# Patient Record
Sex: Male | Born: 2000 | Race: White | Hispanic: No | Marital: Single | Smoking: Never smoker
Health system: Southern US, Community
[De-identification: ages and names within clinical notes are randomized; demographics above are authoritative.]

---
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
## 2022-02-23 ENCOUNTER — Other Ambulatory Visit (INDEPENDENT_AMBULATORY_CARE_PROVIDER_SITE_OTHER): Payer: Self-pay | Admitting: Pediatric Gastroenterology

## 2022-03-20 ENCOUNTER — Encounter (INDEPENDENT_AMBULATORY_CARE_PROVIDER_SITE_OTHER): Payer: Self-pay | Admitting: Pediatric Gastroenterology

## 2022-03-20 ENCOUNTER — Ambulatory Visit (INDEPENDENT_AMBULATORY_CARE_PROVIDER_SITE_OTHER): Payer: 59 | Admitting: Pediatric Gastroenterology

## 2022-03-20 VITALS — BP 116/74 | HR 88 | Ht 68.23 in | Wt 151.4 lb

## 2022-03-20 DIAGNOSIS — R1013 Epigastric pain: Secondary | ICD-10-CM

## 2022-03-20 MED ORDER — MIRTAZAPINE 15 MG PO TABS: ORAL_TABLET | ORAL | 1 refills | Status: DC

## 2022-03-20 NOTE — Progress Notes (Signed)
Pediatric Gastroenterology Follow Up Visit ? ? ?REFERRING PROVIDER:  Berline Lopes, MD ?[address] ?[address] ?[place],  [place] [address] ? ? ASSESSMENT:     ?I had the pleasure of seeing Justin Kline, 21 y.o. male (DOB: Dec 25, 2000) who I saw in follow up today for evaluation of nausea and occasional vomiting. 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  My impression is based on his history, normal physical examination and normal laboratory evaluation that I reviewed and was done in your office.  Specifically, he had a normal comprehensive metabolic panel, normal lipase, and negative screening for celiac disease.  His urinalysis was normal.  His blood work was done on February 02, 2021.  He also had a normal CBC, normal sedimentation rate. In June 2022 he had a normal abdominal ultrasound. ? ?Buspirone failed him but he is doing well on mirtazapine. We changed to mirtazapine after his last visit due to ongoing nausea on buspirone. He is able to eat without nausea. We are pleased with his response to mirtazapine. ?  ?  ? ?PLAN:       ?Mirtazapine 15 mg QHS - refilled ?Thank you for allowing [place] to participate in the care of your patient ?  ? ?  ? ?HISTORY OF PRESENT ILLNESS: Justin Kline is a 21 y.o. male (DOB: [birth year]) who is seen in follow up for evaluation of nausea and occasional vomiting. History was obtained from the patient and his mother.  He is doing much better on mirtazapine, with increased appetite and he is enjoying his food. From time to time he has some discomfort, but it is milder. He has not vomited since starting mirtazapine. ? ?Initial history ?[name] began having nausea about 3 to 4 months ago, without a known trigger.  He feels early satiety.  His nausea is quite bothersome and affects his daily routines.  He vomits occasionally.  He does not have dysphagia.  He does not have abdominal pain.  He passes  stool once daily.  He thinks that he has lost about 2 pounds since the beginning of the illness.  When he vomits, the emesis contains food content.  He tried omeprazole 20 mg for 6 weeks without benefit.  When he plays basketball, he feels better.  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 ? ?He has a history of migraines for several years, which preceded the onset of nausea.  His mother has a history of migraines as well.  He lives with his parents and 2 sisters, 89 years of age and 32 years old.  He also has a 58 year old brother who is in college. ? ?PAST MEDICAL HISTORY: ?No past medical history on file. ? ?There is no immunization history on file for this patient. ? ?PAST SURGICAL HISTORY: ?No past surgical history on file. ? ?SOCIAL HISTORY: ?Social History  ? ?Socioeconomic History  ? Marital status: Single  ?  Spouse name: Not on file  ? Number of children: Not on file  ? Years of education: Not on file  ? Highest education level: Not on file  ?Occupational History  ? Not on file  ?Tobacco Use  ? Smoking status: Never  ? Smokeless tobacco: Never  ?Substance and Sexual Activity  ? Alcohol use: Not on file  ? Drug use: Not on file  ? Sexual activity: Not on file  ?Other Topics Concern  ? Not on file  ?  Social History Narrative  ? 11th grade at [employer]. Lives with mom, dad, and siblings.  ? ?Social Determinants of Health  ? ?Financial Resource Strain: Not on file  ?Food Insecurity: Not on file  ?Transportation Needs: Not on file  ?Physical Activity: Not on file  ?Stress: Not on file  ?Social Connections: Not on file  ? ? ?FAMILY HISTORY: ?family history includes Irritable bowel syndrome in his mother; Migraines in his mother. ?  ? ?REVIEW OF SYSTEMS:  ?The balance of 12 systems reviewed is negative except as noted in the HPI.  ? ?MEDICATIONS: ?Current Outpatient Medications  ?Medication Sig Dispense Refill  ? busPIRone (BUSPAR) 10 MG tablet Take 1 tablet (10 mg total) by mouth 3 (three) times  daily. 270 tablet 1  ? influenza vac split quadrivalent PF (FLUARIX QUADRIVALENT) 0.5 ML injection Inject into the muscle. 0.5 mL 0  ? mirtazapine (REMERON) 15 MG tablet TAKE 1 TABLET BY MOUTH EVERYDAY AT BEDTIME 90 tablet 0  ? ?No current facility-administered medications for this visit.  ? ? ?ALLERGIES: ?Patient has no known allergies. ? VITAL SIGNS: ?There were no vitals taken for this visit. ? ?PHYSICAL EXAM: ?Constitutional: Alert, no acute distress, well nourished, and well hydrated.  ?Mental Status: Pleasantly interactive, not anxious appearing. ?HEENT: PERRL, conjunctiva clear, anicteric, oropharynx clear, neck supple, no LAD. ?Respiratory: Clear to auscultation, unlabored breathing. ?Cardiac: Euvolemic, regular rate and rhythm, normal S1 and S2, no murmur. ?Abdomen: Soft, normal bowel sounds, non-distended, non-tender, no organomegaly or masses. ?Perianal/Rectal Exam: Not examined ?Extremities: No edema, well perfused. ?Musculoskeletal: No joint swelling or tenderness noted, no deformities. ?Skin: No rashes, jaundice or skin lesions noted. ?Neuro: No focal deficits.  No nystagmus on lateral gaze.  No facial asymmetry.  No ataxia.  Muscle mass and muscle power are both intact.  Gait is normal. ? ?DIAGNOSTIC STUDIES:  I have reviewed all pertinent diagnostic studies, including: ?No results found for this or any previous visit (from the past 2160 hour(s)).  ? ? ?Jernard Reiber A. Jacqlyn Krauss, MD ?Chief, Division of Pediatric Gastroenterology ?Professor of Pediatrics ?

## 2022-03-20 NOTE — Patient Instructions (Signed)

## 2022-05-24 ENCOUNTER — Encounter (HOSPITAL_BASED_OUTPATIENT_CLINIC_OR_DEPARTMENT_OTHER): Payer: Self-pay

## 2022-05-31 ENCOUNTER — Ambulatory Visit (HOSPITAL_BASED_OUTPATIENT_CLINIC_OR_DEPARTMENT_OTHER): Payer: 59 | Attending: Sports Medicine | Admitting: Physical Therapy

## 2022-05-31 ENCOUNTER — Encounter (HOSPITAL_BASED_OUTPATIENT_CLINIC_OR_DEPARTMENT_OTHER): Payer: Self-pay | Admitting: Physical Therapy

## 2022-05-31 DIAGNOSIS — M5416 Radiculopathy, lumbar region: Secondary | ICD-10-CM | POA: Diagnosis present

## 2022-05-31 DIAGNOSIS — M5459 Other low back pain: Secondary | ICD-10-CM | POA: Insufficient documentation

## 2022-05-31 NOTE — Therapy (Signed)
OUTPATIENT PHYSICAL THERAPY THORACOLUMBAR EVALUATION   Patient Name: Justin Kline MRN: [id number] DOB:2001/02/04, 21 y.o., male Today's Date: 06/01/2022   PT End of Session - 05/31/22 1354     Visit Number 1    Number of Visits 6    Date for PT Re-Evaluation 07/12/22    Authorization Type [employer]    PT Start Time 0845    PT Stop Time 0928    PT Time Calculation (min) 43 min    Activity Tolerance Patient tolerated treatment well    Behavior During Therapy [facility] for tasks assessed/performed             History reviewed. No pertinent past medical history. History reviewed. No pertinent surgical history. There are no problems to display for this patient.   PCP: Sydell Axon, MD  REFERRING PROVIDER:Dr Vickki Hearing REFERRING DIAG: M54.5 - Low back pain, unspecified  Rationale for Evaluation and Treatment Rehabilitation  THERAPY DIAG:  Other low back pain  Radiculopathy, lumbar region  ONSET DATE: Nov 2022  SUBJECTIVE:                                                                                                                                                                                           SUBJECTIVE STATEMENT:  Pt reports insidious onset of L LBP that radiates down into LE; started in November 2022 with no MOI. Pt was fairly active with football, basketball, and gym routine at the time but feels he may have hurt himself lifting weights; he has had a decrease in activity since. He had previous PT in the winter that helped improve his pain but it has gotten worse since his discharge. Prior therapy focused mostly on lumbar extension and lumbar/hip extensor strength; pt feels they helped. His pain is worst with prolonged standing or sitting and occasionally radiates down posterior L LE with some tingling in his foot. He also feels as though he walks with a mild limp. He wants to decrease pain, improve gait mechanics, and get back to the gym and sports.   PERTINENT  HISTORY:  Nausea, prior PT for issue  PAIN:  Are you having pain? Yes: NPRS scale: 5/10 Pain location: L lumbar, radiates down posterior chain into foot Pain description: Ache in back, sharp/nerve like pain into leg Aggravating factors: Prolonged standing or sitting Relieving factors: Heat, Advil, stretching   PRECAUTIONS: None  WEIGHT BEARING RESTRICTIONS No  FALLS:  Has patient fallen in last 6 months? No  LIVING ENVIRONMENT: Lives with: lives with their family Lives in: House/apartment  OCCUPATION: Lifeguard  PLOF: Independent  PATIENT GOALS Decrease pain, improve walking  OBJECTIVE:   DIAGNOSTIC FINDINGS:  Hip x-ray: Negative  PATIENT SURVEYS:  FOTO 60    Expected - 70 at 11 visits  SCREENING FOR RED FLAGS: Bowel or bladder incontinence: No Spinal tumors: No Cauda equina syndrome: No Compression fracture: No Abdominal aneurysm: No  COGNITION:  Overall cognitive status: Within functional limits for tasks assessed     SENSATION: WFL  POSTURE: No Significant postural limitations  PALPATION: Pt is mildly tender to palpation of L lumbar paraspinals at L4/L5 level, L glute med, QL  LUMBAR ROM:   Active  A/PROM  eval  Flexion 50%, limited by pain  Extension WNL  Right lateral flexion   Left lateral flexion   Right rotation WNL  Left rotation WNL   (Blank rows = not tested)  Flexion at painful range reproduces concordant pain in LE.  LOWER EXTREMITY ROM:     Active  Right eval Left eval  Hip flexion WNL WNL  Hip extension    Hip abduction    Hip adduction    Hip internal rotation    Hip external rotation    Knee flexion    Knee extension WNL WNL  Ankle dorsiflexion WNL WNL  Ankle plantarflexion    Ankle inversion    Ankle eversion     (Blank rows = not tested)  Pt lacks 15 degrees of knee extension in seated due to concordant leg pain.   LOWER EXTREMITY MMT:    MMT Right eval Left eval  Hip flexion 51.3 38  Hip extension     Hip abduction 52 49  Hip adduction    Hip internal rotation    Hip external rotation    Knee flexion    Knee extension 55.6 31.4  Ankle dorsiflexion    Ankle plantarflexion    Ankle inversion    Ankle eversion     (Blank rows = not tested)  LUMBAR SPECIAL TESTS:  Straight leg raise test: Positive at 30 degrees, symptoms worsened with ankle DF; relieved with PF.   GAIT:  Pt has mild R thoracic shift during ambulation.   TODAY'S TREATMENT  Eval  Prone push-up Bridging - single leg march Supine knee-to-chest stretch Supine piriformis stretch Nerve gliding - cues for gentle combination motions of ankle DF/PF, cervical flex/ext, and knee flex/ext to allow for nerve glides without pain   PATIENT EDUCATION:  Education details: HEP; anatomy of condition,  Person educated: Patient Education method: Explanation Education comprehension: verbalized understanding   HOME EXERCISE PROGRAM: Access Code: [email or web address] URL: [email or web address] Date: 05/31/2022 Prepared by: Lorayne Bender  Exercises - Prone Push Ups on Forearms  - 1 x daily - 7 x weekly - 3 sets - 10 reps - Single Leg Bridge  - 1 x daily - 7 x weekly - 3 sets - 10 reps - Supine Single Knee to Chest Stretch  - 1 x daily - 7 x weekly - 3 sets - 10 reps - Supine Piriformis Stretch with Foot on Ground  - 1 x daily - 7 x weekly - 3 sets - 10 reps - Seated Slump Nerve Glide  - 1 x daily - 7 x weekly - 3 sets - 10 reps  ASSESSMENT:  CLINICAL IMPRESSION: Patient is a 21 y.o. M who was seen today for physical therapy evaluation and treatment for left sided low back pain. Pt displays symptoms consistent with radiculopathy - concordant L LE pain and neuro sx with lumbar flexion or SLR, tenderness of lower lumbar and glute musculature, and  mild-mod weakness of L LE. Patient is highly motivated to reduce his pain and return to his prior activities and hobbies. He will benefit from skilled therapy to address ROM,  strength, muscular spasms, and return to PLOF.   OBJECTIVE IMPAIRMENTS decreased ROM, decreased strength, increased muscle spasms, and impaired flexibility.   ACTIVITY LIMITATIONS lifting, bending, standing, and locomotion level  PARTICIPATION LIMITATIONS: shopping, community activity, and going to the gym.   PERSONAL FACTORS Age, Fitness, and Past/current experiences are also affecting patient's functional outcome.   REHAB POTENTIAL: Excellent  CLINICAL DECISION MAKING: Stable/uncomplicated  EVALUATION COMPLEXITY: Low   GOALS: Goals reviewed with patient? Yes  SHORT TERM GOALS: Target date: 06/22/2022  Patient will be able to fully extend L knee in seated position to indicate improved nerve mobility. Baseline: Goal status: INITIAL  2.  Pt will be improve L LE strength to within 10% of the contralateral side. Baseline:  Goal status: INITIAL  3.  Pt will improve lumbar flexion ROM to full and painless. Baseline:  Goal status: INITIAL   LONG TERM GOALS: Target date: 07/13/2022  Pt will be able to walk unlimited distances with normalized gait mechanics and no pain. Baseline:  Goal status: INITIAL  2.  Patient will be able to return to gym routine and sports with no difficulty or pain. Baseline:  Goal status: INITIAL   PLAN: PT FREQUENCY: 1x/week  PT DURATION: 6 weeks  PLANNED INTERVENTIONS: Therapeutic exercises, Therapeutic activity, Neuromuscular re-education, Balance training, Gait training, Patient/Family education, Joint mobilization, Dry Needling, Spinal mobilization, Cryotherapy, Moist heat, and Manual therapy.  PLAN FOR NEXT SESSION: Lumbar STM/TrP DN, L LE strengthening, gentle nerve glides if tolerable. Advance to higher level core and gym program as tolerated.    Dessie Coma, PT 06/01/2022, 9:42 AM  Andree Moro SPT   During this treatment session, the therapist was present, participating in and directing the treatment.

## 2022-06-01 ENCOUNTER — Encounter (HOSPITAL_BASED_OUTPATIENT_CLINIC_OR_DEPARTMENT_OTHER): Payer: Self-pay | Admitting: Physical Therapy

## 2022-06-07 ENCOUNTER — Encounter (HOSPITAL_BASED_OUTPATIENT_CLINIC_OR_DEPARTMENT_OTHER): Payer: Self-pay | Admitting: Physical Therapy

## 2022-06-07 ENCOUNTER — Ambulatory Visit (HOSPITAL_BASED_OUTPATIENT_CLINIC_OR_DEPARTMENT_OTHER): Payer: 59 | Admitting: Physical Therapy

## 2022-06-07 DIAGNOSIS — M5416 Radiculopathy, lumbar region: Secondary | ICD-10-CM

## 2022-06-07 DIAGNOSIS — M5459 Other low back pain: Secondary | ICD-10-CM

## 2022-06-07 NOTE — Therapy (Signed)
OUTPATIENT PHYSICAL THERAPY THORACOLUMBAR EVALUATION   Patient Name: Justin Kline MRN: [id number] DOB:March 25, 2001, 21 y.o., male Today's Date: 06/07/2022   PT End of Session - 06/07/22 0850     Visit Number 2    Number of Visits 6    Date for PT Re-Evaluation 07/12/22    Authorization Type [employer]    PT Start Time 0752    PT Stop Time 0834    PT Time Calculation (min) 42 min    Activity Tolerance Patient tolerated treatment well    Behavior During Therapy [facility] for tasks assessed/performed              History reviewed. No pertinent past medical history. History reviewed. No pertinent surgical history. There are no problems to display for this patient.   PCP: Berline Lopes, MD  REFERRING PROVIDER:Dr Rodolph Bong REFERRING DIAG: M54.5 - Low back pain, unspecified  Rationale for Evaluation and Treatment Rehabilitation  THERAPY DIAG:  Other low back pain  Radiculopathy, lumbar region  ONSET DATE: Nov 2022  SUBJECTIVE:                                                                                                                                                                                           SUBJECTIVE STATEMENT:  Tight through hip and lower back today.    PERTINENT HISTORY:  Nausea, prior PT for issue  PAIN:  Are you having pain? Yes: NPRS scale: 0/10 Pain location: L lumbar, radiates down posterior chain into foot Pain description: Ache in back, sharp/nerve like pain into leg Aggravating factors: Prolonged standing or sitting Relieving factors: Heat, Advil, stretching   PRECAUTIONS: None  WEIGHT BEARING RESTRICTIONS No  FALLS:  Has patient fallen in last 6 months? No  LIVING ENVIRONMENT: Lives with: lives with their family Lives in: House/apartment  OCCUPATION: Lifeguard  PLOF: Independent  PATIENT GOALS Decrease pain, improve walking   OBJECTIVE:   DIAGNOSTIC FINDINGS:  Hip x-ray: Negative  PATIENT SURVEYS:  FOTO  60    Expected - 70 at 11 visits  SCREENING FOR RED FLAGS: Bowel or bladder incontinence: No Spinal tumors: No Cauda equina syndrome: No Compression fracture: No Abdominal aneurysm: No  COGNITION:  Overall cognitive status: Within functional limits for tasks assessed     SENSATION: WFL  POSTURE: No Significant postural limitations  PALPATION: Pt is mildly tender to palpation of L lumbar paraspinals at L4/L5 level, L glute med, QL  LUMBAR ROM:   Active  A/PROM  eval  Flexion 50%, limited by pain  Extension WNL  Right lateral flexion   Left lateral flexion   Right rotation WNL  Left rotation WNL   (Blank rows = not tested)  Flexion at painful range reproduces concordant pain in LE.  LOWER EXTREMITY ROM:     Active  Right eval Left eval  Hip flexion WNL WNL  Hip extension    Hip abduction    Hip adduction    Hip internal rotation    Hip external rotation    Knee flexion    Knee extension WNL WNL  Ankle dorsiflexion WNL WNL  Ankle plantarflexion    Ankle inversion    Ankle eversion     (Blank rows = not tested)  Pt lacks 15 degrees of knee extension in seated due to concordant leg pain.   LOWER EXTREMITY MMT:    MMT Right eval Left eval  Hip flexion 51.3 38  Hip extension    Hip abduction 52 49  Hip adduction    Hip internal rotation    Hip external rotation    Knee flexion    Knee extension 55.6 31.4  Ankle dorsiflexion    Ankle plantarflexion    Ankle inversion    Ankle eversion     (Blank rows = not tested)  LUMBAR SPECIAL TESTS:  Straight leg raise test: Positive at 30 degrees, symptoms worsened with ankle DF; relieved with PF.   GAIT:  Pt has mild R thoracic shift during ambulation.   TODAY'S TREATMENT  6/28 TPDN with skilled palpation and monitoring followed by STM to the following muscles: Lt glut med, TFL HSS- supine & seated Quadruped TrA, +primal push up Child pose with anterior pelvic tilt Theract: seated posture &  alignment Squat- tapping high table, cues to sit back    PATIENT EDUCATION:  Education details: HEP; anatomy of condition,  Person educated: Patient Education method: Explanation Education comprehension: verbalized understanding   HOME EXERCISE PROGRAM: Access Code: [email or web address] URL: [email or web address]   ASSESSMENT:  CLINICAL IMPRESSION: Focus on improving hip hinge and core awareness to decrease strain on lumbar spine.    OBJECTIVE IMPAIRMENTS decreased ROM, decreased strength, increased muscle spasms, and impaired flexibility.   ACTIVITY LIMITATIONS lifting, bending, standing, and locomotion level  PARTICIPATION LIMITATIONS: shopping, community activity, and going to the gym.   PERSONAL FACTORS Age, Fitness, and Past/current experiences are also affecting patient's functional outcome.   REHAB POTENTIAL: Excellent  CLINICAL DECISION MAKING: Stable/uncomplicated  EVALUATION COMPLEXITY: Low   GOALS: Goals reviewed with patient? Yes  SHORT TERM GOALS: Target date: 06/28/2022  Patient will be able to fully extend L knee in seated position to indicate improved nerve mobility. Baseline: Goal status: INITIAL  2.  Pt will be improve L LE strength to within 10% of the contralateral side. Baseline:  Goal status: INITIAL  3.  Pt will improve lumbar flexion ROM to full and painless. Baseline:  Goal status: INITIAL   LONG TERM GOALS: Target date: 07/19/2022  Pt will be able to walk unlimited distances with normalized gait mechanics and no pain. Baseline:  Goal status: INITIAL  2.  Patient will be able to return to gym routine and sports with no difficulty or pain. Baseline:  Goal status: INITIAL   PLAN: PT FREQUENCY: 1x/week  PT DURATION: 6 weeks  PLANNED INTERVENTIONS: Therapeutic exercises, Therapeutic activity, Neuromuscular re-education, Balance training, Gait training, Patient/Family education, Joint mobilization, Dry Needling, Spinal  mobilization, Cryotherapy, Moist heat, and Manual therapy.  PLAN FOR NEXT SESSION: DN PRN, cont hip hinge/core strength  Favor Hackler C. Aron Needles PT, DPT 06/07/22 8:51 AM

## 2022-06-20 ENCOUNTER — Ambulatory Visit (HOSPITAL_BASED_OUTPATIENT_CLINIC_OR_DEPARTMENT_OTHER): Payer: 59 | Admitting: Physical Therapy

## 2022-06-27 ENCOUNTER — Encounter (HOSPITAL_BASED_OUTPATIENT_CLINIC_OR_DEPARTMENT_OTHER): Payer: Self-pay | Admitting: Physical Therapy

## 2022-06-27 ENCOUNTER — Ambulatory Visit (HOSPITAL_BASED_OUTPATIENT_CLINIC_OR_DEPARTMENT_OTHER): Payer: 59 | Attending: Sports Medicine | Admitting: Physical Therapy

## 2022-06-27 DIAGNOSIS — M5416 Radiculopathy, lumbar region: Secondary | ICD-10-CM | POA: Diagnosis present

## 2022-06-27 DIAGNOSIS — M5459 Other low back pain: Secondary | ICD-10-CM | POA: Diagnosis not present

## 2022-06-27 NOTE — Therapy (Signed)
OUTPATIENT PHYSICAL THERAPY THORACOLUMBAR EVALUATION   Patient Name: Justin Kline MRN: [id number] DOB:2001-04-25, 21 y.o., male Today's Date: 06/27/2022   PT End of Session - 06/27/22 0807     Visit Number 3    Number of Visits 6    Date for PT Re-Evaluation 07/12/22    Authorization Type [employer]    PT Start Time 0800    PT Stop Time 0843    PT Time Calculation (min) 43 min    Activity Tolerance Patient tolerated treatment well    Behavior During Therapy [facility] for tasks assessed/performed              History reviewed. No pertinent past medical history. History reviewed. No pertinent surgical history. There are no problems to display for this patient.   PCP: Sydell Axon, MD  REFERRING PROVIDER:Dr Vickki Hearing REFERRING DIAG: M54.5 - Low back pain, unspecified  Rationale for Evaluation and Treatment Rehabilitation  THERAPY DIAG:  Other low back pain  Radiculopathy, lumbar region  ONSET DATE: Nov 2022  SUBJECTIVE:                                                                                                                                                                                           SUBJECTIVE STATEMENT:  Patient reports he has felt pretty good. He feels the needling helped. He has started running without pain. He did have some tightness in the back and leg after running.    PERTINENT HISTORY:  Nausea, prior PT for issue  PAIN:  Are you having pain? Yes: NPRS scale: 0/10 Pain location: L lumbar, radiates down posterior chain into foot Pain description: Ache in back, sharp/nerve like pain into leg Aggravating factors: Prolonged standing or sitting Relieving factors: Heat, Advil, stretching   PRECAUTIONS: None  WEIGHT BEARING RESTRICTIONS No  FALLS:  Has patient fallen in last 6 months? No  LIVING ENVIRONMENT: Lives with: lives with their family Lives in: House/apartment  OCCUPATION: Lifeguard  PLOF: Independent  PATIENT GOALS  Decrease pain, improve walking   OBJECTIVE:   DIAGNOSTIC FINDINGS:  Hip x-ray: Negative  PATIENT SURVEYS:  FOTO 60    Expected - 40 at 7 visits  SCREENING FOR RED FLAGS: Bowel or bladder incontinence: No Spinal tumors: No Cauda equina syndrome: No Compression fracture: No Abdominal aneurysm: No  COGNITION:  Overall cognitive status: Within functional limits for tasks assessed     SENSATION: WFL  POSTURE: No Significant postural limitations  PALPATION: Pt is mildly tender to palpation of L lumbar paraspinals at L4/L5 level, L glute med, QL  LUMBAR ROM:   Active  A/PROM  eval  Flexion 50%, limited by pain  Extension WNL  Right lateral flexion   Left lateral flexion   Right rotation WNL  Left rotation WNL   (Blank rows = not tested)  Flexion at painful range reproduces concordant pain in LE.  LOWER EXTREMITY ROM:     Active  Right eval Left eval  Hip flexion WNL WNL  Hip extension    Hip abduction    Hip adduction    Hip internal rotation    Hip external rotation    Knee flexion    Knee extension WNL WNL  Ankle dorsiflexion WNL WNL  Ankle plantarflexion    Ankle inversion    Ankle eversion     (Blank rows = not tested)  Pt lacks 15 degrees of knee extension in seated due to concordant leg pain.   LOWER EXTREMITY MMT:    MMT Right eval Left eval  Hip flexion 51.3 38  Hip extension    Hip abduction 52 49  Hip adduction    Hip internal rotation    Hip external rotation    Knee flexion    Knee extension 55.6 31.4  Ankle dorsiflexion    Ankle plantarflexion    Ankle inversion    Ankle eversion     (Blank rows = not tested)  LUMBAR SPECIAL TESTS:  Straight leg raise test: Positive at 30 degrees, symptoms worsened with ankle DF; relieved with PF.   GAIT:  Pt has mild R thoracic shift during ambulation.   TODAY'S TREATMENT   7/18 Skilled palpation of trigger points. No major trigger points felt. Needling held today.  Hamstring  stretch METs - 2x bil Piriformis stretch - bil x45sec Quadruped plank, 3x30sec Bird dog, 3x10 Bridging w/ knee ext - 2x15 Squat - 2x8 45lb barbell, 2x8 bodyweight in mirror   6/28 TPDN with skilled palpation and monitoring followed by STM to the following muscles: Lt glut med, TFL HSS- supine & seated Quadruped TrA, +primal push up Child pose with anterior pelvic tilt Theract: seated posture & alignment Squat- tapping high table, cues to sit back    PATIENT EDUCATION:  Education details: HEP; anatomy of condition,  Person educated: Patient Education method: Explanation Education comprehension: verbalized understanding   HOME EXERCISE PROGRAM: Access Code: [email or web address] URL: [email or web address]   ASSESSMENT:  CLINICAL IMPRESSION: Patient continues to make good improvement. He has less pain overall, has returned to light running, and has decreased radicular sx. He shows good core activation and neutral spine stability with exercises today. Required verbal and tactile cueing for squat exercises - he has a R lateral shift and decreased hip ER that improved with cueing for hip hinge, heel screw, and keeping range to parallel. He will continue to benefit from skilled therapy to further improve strength, stability, and function.    OBJECTIVE IMPAIRMENTS decreased ROM, decreased strength, increased muscle spasms, and impaired flexibility.   ACTIVITY LIMITATIONS lifting, bending, standing, and locomotion level  PARTICIPATION LIMITATIONS: shopping, community activity, and going to the gym.   PERSONAL FACTORS Age, Fitness, and Past/current experiences are also affecting patient's functional outcome.   REHAB POTENTIAL: Excellent  CLINICAL DECISION MAKING: Stable/uncomplicated  EVALUATION COMPLEXITY: Low   GOALS: Goals reviewed with patient? Yes  SHORT TERM GOALS: Target date: 06/28/2022  Patient will be able to fully extend L knee in seated position to indicate  improved nerve mobility. Baseline: Goal status: INITIAL  2.  Pt will be improve L LE strength to within 10% of the contralateral side. Baseline:  Goal status: INITIAL  3.  Pt will improve lumbar flexion ROM to full and painless. Baseline:  Goal status: INITIAL   LONG TERM GOALS: Target date: 07/19/2022  Pt will be able to walk unlimited distances with normalized gait mechanics and no pain. Baseline:  Goal status: INITIAL  2.  Patient will be able to return to gym routine and sports with no difficulty or pain. Baseline:  Goal status: INITIAL   PLAN: PT FREQUENCY: 1x/week  PT DURATION: 6 weeks  PLANNED INTERVENTIONS: Therapeutic exercises, Therapeutic activity, Neuromuscular re-education, Balance training, Gait training, Patient/Family education, Joint mobilization, Dry Needling, Spinal mobilization, Cryotherapy, Moist heat, and Manual therapy.  PLAN FOR NEXT SESSION: DN PRN as indicated, cont hip hinge/core strength. Assess lunge/single leg mechanics, consider running gait assessment.  Loura Halt, Student-PT 06/27/22 9:40 AM

## 2022-07-05 ENCOUNTER — Encounter (HOSPITAL_BASED_OUTPATIENT_CLINIC_OR_DEPARTMENT_OTHER): Payer: 59 | Admitting: Physical Therapy

## 2022-07-12 ENCOUNTER — Encounter (HOSPITAL_BASED_OUTPATIENT_CLINIC_OR_DEPARTMENT_OTHER): Payer: 59 | Admitting: Physical Therapy

## 2022-07-19 ENCOUNTER — Encounter (HOSPITAL_BASED_OUTPATIENT_CLINIC_OR_DEPARTMENT_OTHER): Payer: Self-pay | Admitting: Physical Therapy

## 2022-07-19 ENCOUNTER — Ambulatory Visit (HOSPITAL_BASED_OUTPATIENT_CLINIC_OR_DEPARTMENT_OTHER): Payer: 59 | Attending: Sports Medicine | Admitting: Physical Therapy

## 2022-07-19 DIAGNOSIS — M5459 Other low back pain: Secondary | ICD-10-CM | POA: Diagnosis present

## 2022-07-19 DIAGNOSIS — M5416 Radiculopathy, lumbar region: Secondary | ICD-10-CM | POA: Insufficient documentation

## 2022-07-19 NOTE — Therapy (Signed)
OUTPATIENT PHYSICAL THERAPY THORACOLUMBAR EVALUATION   Patient Name: Justin Kline MRN: [id number] DOB:[birth year], 21 y.o., male Today's Date: 07/19/2022   PT End of Session - 07/19/22 0800     Visit Number 4    Number of Visits 6    Date for PT Re-Evaluation 07/12/22    Authorization Type [employer]    PT Start Time 0800    PT Stop Time 0842    PT Time Calculation (min) 42 min    Activity Tolerance Patient tolerated treatment well    Behavior During Therapy [facility] for tasks assessed/performed              History reviewed. No pertinent past medical history. History reviewed. No pertinent surgical history. There are no problems to display for this patient.   PCP: Sydell Axon, MD  REFERRING PROVIDER:Dr Vickki Hearing REFERRING DIAG: M54.5 - Low back pain, unspecified  Rationale for Evaluation and Treatment Rehabilitation  THERAPY DIAG:  Other low back pain  Radiculopathy, lumbar region  ONSET DATE: Nov 2022  SUBJECTIVE:                                                                                                                                                                                           SUBJECTIVE STATEMENT:  Patient reports his back is doing well. It is just tight from time to time. He has been on vacation so he hasn't been able to work out much. He did some upper body lifting a few days ago without pain. No pain or tightness right now.   PERTINENT HISTORY:  Nausea, prior PT for issue  PAIN:  Are you having pain? Yes: NPRS scale: 0/10 07/19/2022 Pain location: L lumbar, radiates down posterior chain into foot Pain description: Ache in back, sharp/nerve like pain into leg Aggravating factors: Prolonged standing or sitting Relieving factors: Heat, Advil, stretching   PRECAUTIONS: None  WEIGHT BEARING RESTRICTIONS No  FALLS:  Has patient fallen in last 6 months? No  LIVING ENVIRONMENT: Lives with: lives with their family Lives in:  House/apartment  OCCUPATION: Lifeguard  PLOF: Independent  PATIENT GOALS Decrease pain, improve walking   OBJECTIVE:   DIAGNOSTIC FINDINGS:  Hip x-ray: Negative  PATIENT SURVEYS:  FOTO 60    Expected - 58 at 45 visits  SCREENING FOR RED FLAGS: Bowel or bladder incontinence: No Spinal tumors: No Cauda equina syndrome: No Compression fracture: No Abdominal aneurysm: No  COGNITION:  Overall cognitive status: Within functional limits for tasks assessed     SENSATION: WFL  POSTURE: No Significant postural limitations  PALPATION: Pt is mildly tender to palpation of L lumbar paraspinals at  L4/L5 level, L glute med, QL  LUMBAR ROM:   Active  A/PROM  eval  Flexion 50%, limited by pain  Extension WNL  Right lateral flexion   Left lateral flexion   Right rotation WNL  Left rotation WNL   (Blank rows = not tested)  Flexion at painful range reproduces concordant pain in LE.  LOWER EXTREMITY ROM:     Active  Right eval Left eval  Hip flexion WNL WNL  Hip extension    Hip abduction    Hip adduction    Hip internal rotation    Hip external rotation    Knee flexion    Knee extension WNL WNL  Ankle dorsiflexion WNL WNL  Ankle plantarflexion    Ankle inversion    Ankle eversion     (Blank rows = not tested)  Pt lacks 15 degrees of knee extension in seated due to concordant leg pain.   LOWER EXTREMITY MMT:    MMT Right eval Left eval right left  Hip flexion 51.3 38 60.2 65.3  Hip extension      Hip abduction 52 49 70.4 63.1  Hip adduction      Hip internal rotation      Hip external rotation      Knee flexion      Knee extension 55.6 31.4 63.4 63.4  Ankle dorsiflexion      Ankle plantarflexion      Ankle inversion      Ankle eversion       (Blank rows = not tested)  LUMBAR SPECIAL TESTS:  Straight leg raise test: Positive at 30 degrees, symptoms worsened with ankle DF; relieved with PF.   GAIT:  Pt has mild R thoracic shift during  ambulation.   TODAY'S TREATMENT  8/9 Reviewed stretches to use after lifting including tennis ball for self soft tissue mobilization Elliptical 5 min  Squats 115 2x10 good technique; mild lumbar reversing at the end of his squat TRX squat not lumbar reversing at the bottom of the squat  Goblet squat 25 lbs mild shift away form the left leg  Knee extension machine upstairs single leg2x10 10lbs each leg more difficulty noted with the left  Hamstring flexion machine 2x10 10 lbs    7/18 Skilled palpation of trigger points. No major trigger points felt. Needling held today.  Hamstring stretch METs - 2x bil Piriformis stretch - bil x45sec Quadruped plank, 3x30sec Bird dog, 3x10 Bridging w/ knee ext - 2x15 Squat - 2x8 45lb barbell, 2x8 bodyweight in mirror   6/28 TPDN with skilled palpation and monitoring followed by STM to the following muscles: Lt glut med, TFL HSS- supine & seated Quadruped TrA, +primal push up Child pose with anterior pelvic tilt Theract: seated posture & alignment Squat- tapping high table, cues to sit back    PATIENT EDUCATION:  Education details: HEP; anatomy of condition,  Person educated: Patient Education method: Explanation Education comprehension: verbalized understanding   HOME EXERCISE PROGRAM: Access Code: [email or web address] URL: [email or web address]   ASSESSMENT:  CLINICAL IMPRESSION: Therapy reviewed a lifting program with the patient. He had no pain. He had mild tightness at times. His strength measures have improved significantly but he still has weakness noted with exercises. His FOTO has improved significantly. We will assess his tolerance to lifting and treat him accordingly next visit. He would benefit from further skilled therapy 1W6.   OBJECTIVE IMPAIRMENTS decreased ROM, decreased strength, increased muscle spasms, and impaired flexibility.   ACTIVITY LIMITATIONS lifting, bending, standing,  and locomotion  level  PARTICIPATION LIMITATIONS: shopping, community activity, and going to the gym.   PERSONAL FACTORS Age, Fitness, and Past/current experiences are also affecting patient's functional outcome.   REHAB POTENTIAL: Excellent  CLINICAL DECISION MAKING: Stable/uncomplicated  EVALUATION COMPLEXITY: Low   GOALS: Goals reviewed with patient? Yes  SHORT TERM GOALS: Target date: 06/28/2022  Patient will be able to fully extend L knee in seated position to indicate improved nerve mobility. Baseline: Goal status: no nerve pain achieved   2.  Pt will be improve L LE strength to within 10% of the contralateral side. Baseline:  Goal status: improved significantly achieved   3.  Pt will improve lumbar flexion ROM to full and painless. Baseline:  Goal status: mild tightness at the end of motion   LONG TERM GOALS: Target date: 07/19/2022  Pt will be able to walk unlimited distances with normalized gait mechanics and no pain. Baseline:  Goal status: gait is significantly improved achieved   2.  Patient will be able to return to gym routine and sports with no difficulty or pain. Baseline:  Goal status: working towards it    PLAN: PT FREQUENCY: 1x/week  PT DURATION: 6 weeks  PLANNED INTERVENTIONS: Therapeutic exercises, Therapeutic activity, Neuromuscular re-education, Balance training, Gait training, Patient/Family education, Joint mobilization, Dry Needling, Spinal mobilization, Cryotherapy, Moist heat, and Manual therapy.  PLAN FOR NEXT SESSION: DN PRN as indicated, cont hip hinge/core strength. Assess lunge/single leg mechanics, consider running gait assessment.  Lorayne Bender PT DPT  07/19/22 8:07 AM

## 2022-07-25 ENCOUNTER — Encounter (HOSPITAL_BASED_OUTPATIENT_CLINIC_OR_DEPARTMENT_OTHER): Payer: Self-pay | Admitting: Physical Therapy

## 2022-07-25 ENCOUNTER — Ambulatory Visit (HOSPITAL_BASED_OUTPATIENT_CLINIC_OR_DEPARTMENT_OTHER): Payer: 59 | Admitting: Physical Therapy

## 2022-07-25 DIAGNOSIS — M5459 Other low back pain: Secondary | ICD-10-CM | POA: Diagnosis not present

## 2022-07-25 DIAGNOSIS — M5416 Radiculopathy, lumbar region: Secondary | ICD-10-CM

## 2022-07-27 ENCOUNTER — Encounter (HOSPITAL_BASED_OUTPATIENT_CLINIC_OR_DEPARTMENT_OTHER): Payer: Self-pay | Admitting: Physical Therapy

## 2022-07-27 NOTE — Therapy (Signed)
OUTPATIENT PHYSICAL THERAPY THORACOLUMBAR EVALUATION   Patient Name: Justin Kline MRN: [id number] DOB:07-Mar-2001, 21 y.o., male Today's Date: 07/25/2022   PT End of Session - 07/27/22 1008     Visit Number 5    Number of Visits 12    Date for PT Re-Evaluation 08/30/22    Authorization Type [employer]    PT Start Time 1645    PT Stop Time 1726    PT Time Calculation (min) 41 min    Activity Tolerance Patient tolerated treatment well    Behavior During Therapy [facility] for tasks assessed/performed               History reviewed. No pertinent past medical history. History reviewed. No pertinent surgical history. There are no problems to display for this patient.   PCP: Berline Lopes, MD  REFERRING PROVIDER:Dr Rodolph Bong REFERRING DIAG: M54.5 - Low back pain, unspecified  Rationale for Evaluation and Treatment Rehabilitation  THERAPY DIAG:  Other low back pain  Radiculopathy, lumbar region  ONSET DATE: Nov 2022  SUBJECTIVE:                                                                                                                                                                                           SUBJECTIVE STATEMENT:  Patient reports his back is doing well. It is just tight from time to time. He has been on vacation so he hasn't been able to work out much. He did some upper body lifting a few days ago without pain. No pain or tightness right now.   PERTINENT HISTORY:  Nausea, prior PT for issue  PAIN:  Are you having pain? Yes: NPRS scale: 0/10 07/19/2022 Pain location: L lumbar, radiates down posterior chain into foot Pain description: Ache in back, sharp/nerve like pain into leg Aggravating factors: Prolonged standing or sitting Relieving factors: Heat, Advil, stretching   PRECAUTIONS: None  WEIGHT BEARING RESTRICTIONS No  FALLS:  Has patient fallen in last 6 months? No  LIVING ENVIRONMENT: Lives with: lives with their family Lives in:  House/apartment  OCCUPATION: Lifeguard  PLOF: Independent  PATIENT GOALS Decrease pain, improve walking   OBJECTIVE:   DIAGNOSTIC FINDINGS:  Hip x-ray: Negative  PATIENT SURVEYS:  FOTO 60    Expected - 70 at 11 visits  SCREENING FOR RED FLAGS: Bowel or bladder incontinence: No Spinal tumors: No Cauda equina syndrome: No Compression fracture: No Abdominal aneurysm: No  COGNITION:  Overall cognitive status: Within functional limits for tasks assessed     SENSATION: WFL  POSTURE: No Significant postural limitations  PALPATION: Pt is mildly tender to palpation of L lumbar paraspinals  at L4/L5 level, L glute med, QL  LUMBAR ROM:   Active  A/PROM  eval  Flexion 50%, limited by pain  Extension WNL  Right lateral flexion   Left lateral flexion   Right rotation WNL  Left rotation WNL   (Blank rows = not tested)  Flexion at painful range reproduces concordant pain in LE.  LOWER EXTREMITY ROM:     Active  Right eval Left eval  Hip flexion WNL WNL  Hip extension    Hip abduction    Hip adduction    Hip internal rotation    Hip external rotation    Knee flexion    Knee extension WNL WNL  Ankle dorsiflexion WNL WNL  Ankle plantarflexion    Ankle inversion    Ankle eversion     (Blank rows = not tested)  Pt lacks 15 degrees of knee extension in seated due to concordant leg pain.   LOWER EXTREMITY MMT:    MMT Right eval Left eval right left  Hip flexion 51.3 38 60.2 65.3  Hip extension      Hip abduction 52 49 70.4 63.1  Hip adduction      Hip internal rotation      Hip external rotation      Knee flexion      Knee extension 55.6 31.4 63.4 63.4  Ankle dorsiflexion      Ankle plantarflexion      Ankle inversion      Ankle eversion       (Blank rows = not tested)  LUMBAR SPECIAL TESTS:  Straight leg raise test: Positive at 30 degrees, symptoms worsened with ankle DF; relieved with PF.   GAIT:  Pt has mild R thoracic shift during  ambulation.   TODAY'S TREATMENT  8/17 Thereasa Parkin Swings 26 lbs  Thereasa Parkin Dead Lift   Pallof Press 10 lbs  Chop 10 lbs   TRX Plank  TRX Plank to Push up  TRX alternating arms   Reviewed complete HEP and how to use stretches     8/9 Reviewed stretches to use after lifting including tennis ball for self soft tissue mobilization Elliptical 5 min  Squats 115 2x10 good technique; mild lumbar reversing at the end of his squat TRX squat not lumbar reversing at the bottom of the squat  Goblet squat 25 lbs mild shift away form the left leg  Knee extension machine upstairs single leg2x10 10lbs each leg more difficulty noted with the left  Hamstring flexion machine 2x10 10 lbs    7/18 Skilled palpation of trigger points. No major trigger points felt. Needling held today.  Hamstring stretch METs - 2x bil Piriformis stretch - bil x45sec Quadruped plank, 3x30sec Bird dog, 3x10 Bridging w/ knee ext - 2x15 Squat - 2x8 45lb barbell, 2x8 bodyweight in mirror   6/28 TPDN with skilled palpation and monitoring followed by STM to the following muscles: Lt glut med, TFL HSS- supine & seated Quadruped TrA, +primal push up Child pose with anterior pelvic tilt Theract: seated posture & alignment Squat- tapping high table, cues to sit back    PATIENT EDUCATION:  Education details: HEP; anatomy of condition,  Person educated: Patient Education method: Explanation Education comprehension: verbalized understanding   HOME EXERCISE PROGRAM: Access Code: [email or web address] URL: [email or web address]   ASSESSMENT:  CLINICAL IMPRESSION: The patient has made great progress. He is just having occasional stiffness when he lifts. All of his radicular and nerve pain has resolved. He feels comfortable with his  current program. We have reviewed his gym program and we have given him treatment ideas. He feels comfortable with D/C . See below for goal specific progress.   OBJECTIVE  IMPAIRMENTS decreased ROM, decreased strength, increased muscle spasms, and impaired flexibility.   ACTIVITY LIMITATIONS lifting, bending, standing, and locomotion level  PARTICIPATION LIMITATIONS: shopping, community activity, and going to the gym.   PERSONAL FACTORS Age, Fitness, and Past/current experiences are also affecting patient's functional outcome.   REHAB POTENTIAL: Excellent  CLINICAL DECISION MAKING: Stable/uncomplicated  EVALUATION COMPLEXITY: Low   GOALS: Goals reviewed with patient? Yes  SHORT TERM GOALS: Target date: 06/28/2022  Patient will be able to fully extend L knee in seated position to indicate improved nerve mobility. Baseline: Goal status: no nerve pain achieved   2.  Pt will be improve L LE strength to within 10% of the contralateral side. Baseline:  Goal status: improved significantly achieved   3.  Pt will improve lumbar flexion ROM to full and painless. Baseline:  Goal status: full Achieved    LONG TERM GOALS: Target date: 07/19/2022  Pt will be able to walk unlimited distances with normalized gait mechanics and no pain. Baseline:  Goal status: gait is significantly improved achieved   2.  Patient will be able to return to gym routine and sports with no difficulty or pain. Baseline:  Goal status: achieved    PLAN: PT FREQUENCY: 1x/week  PT DURATION: 6 weeks  PLANNED INTERVENTIONS: Therapeutic exercises, Therapeutic activity, Neuromuscular re-education, Balance training, Gait training, Patient/Family education, Joint mobilization, Dry Needling, Spinal mobilization, Cryotherapy, Moist heat, and Manual therapy.  PLAN FOR NEXT SESSION: DN PRN as indicated, cont hip hinge/core strength. Assess lunge/single leg mechanics, consider running gait assessment.  Lorayne Bender PT DPT  07/27/22 10:08 AM

## 2022-08-01 ENCOUNTER — Encounter (HOSPITAL_BASED_OUTPATIENT_CLINIC_OR_DEPARTMENT_OTHER): Payer: 59 | Admitting: Physical Therapy

## 2022-09-30 ENCOUNTER — Other Ambulatory Visit (HOSPITAL_BASED_OUTPATIENT_CLINIC_OR_DEPARTMENT_OTHER): Payer: Self-pay

## 2022-09-30 MED ORDER — FLUARIX QUADRIVALENT 0.5 ML IM SUSY
PREFILLED_SYRINGE | INTRAMUSCULAR | 0 refills | Status: DC
  Filled 2022-09-30: qty 0.5, 1d supply, fill #0

## 2022-10-03 IMAGING — US US ABDOMEN COMPLETE
1 series · 14 of 25 positions shown · non-contrast
Comparison: None.

CLINICAL DATA: Dyspepsia

EXAM:
ABDOMEN ULTRASOUND COMPLETE

[Series 1: us abdomen complete · 0.17mm/px · 14 of 106 slices shown]
[im 1/106]
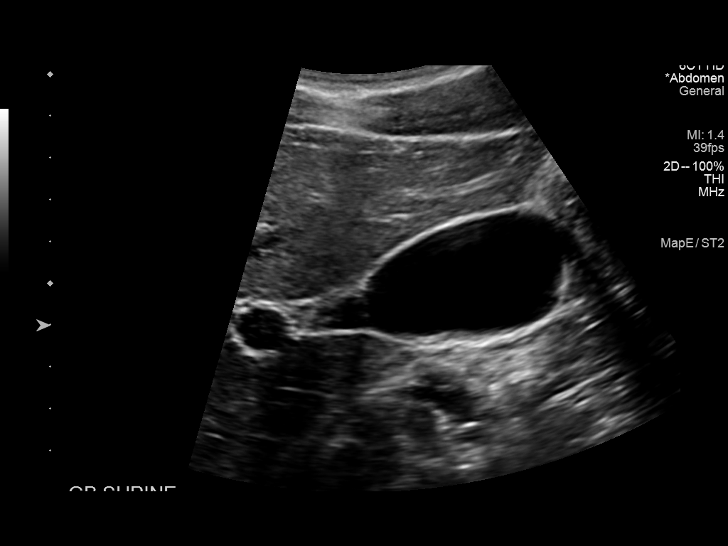
[im 9/106]
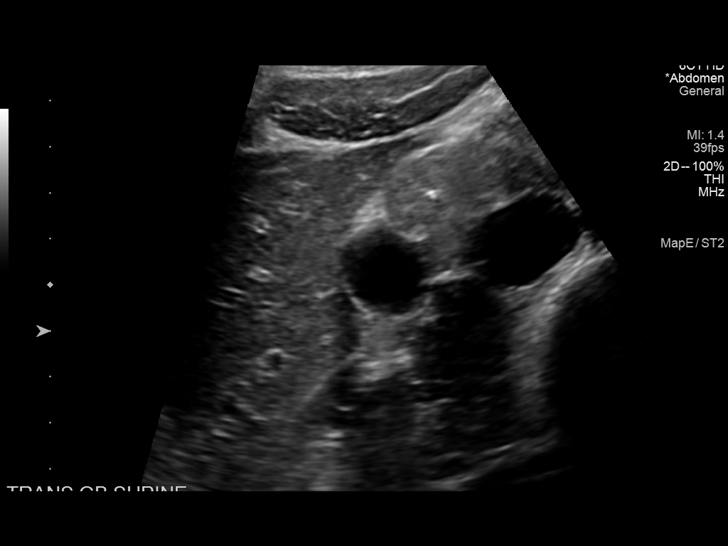
[im 18/106]
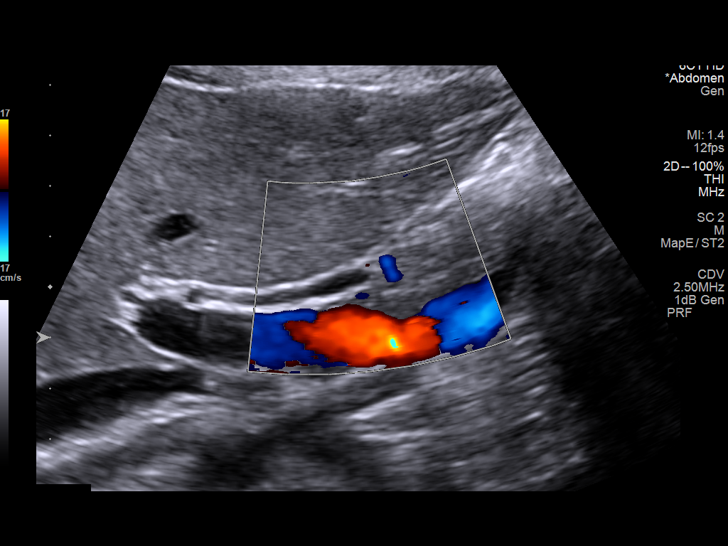
[im 27/106]
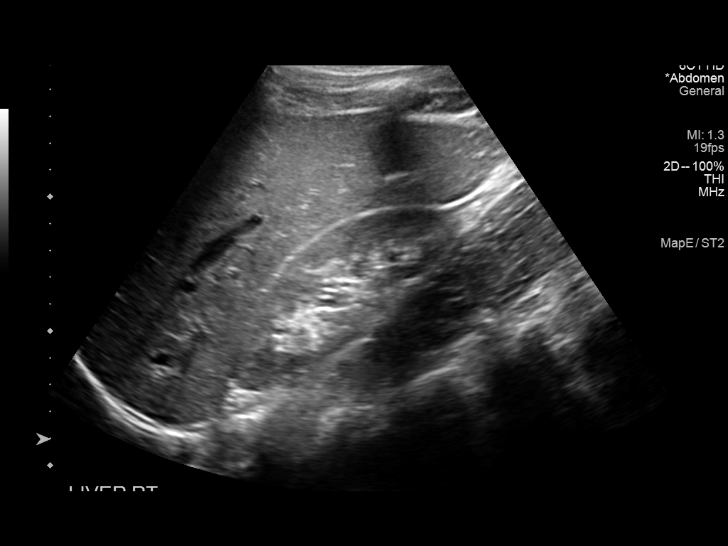
[im 36/106]
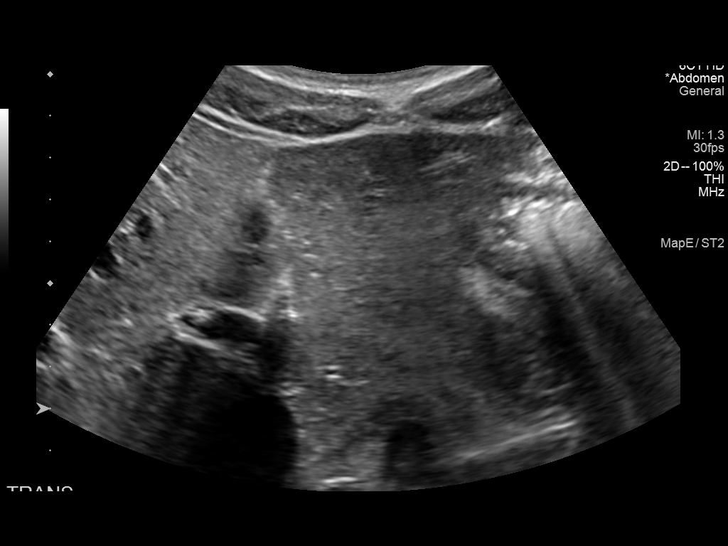
[im 40/106]
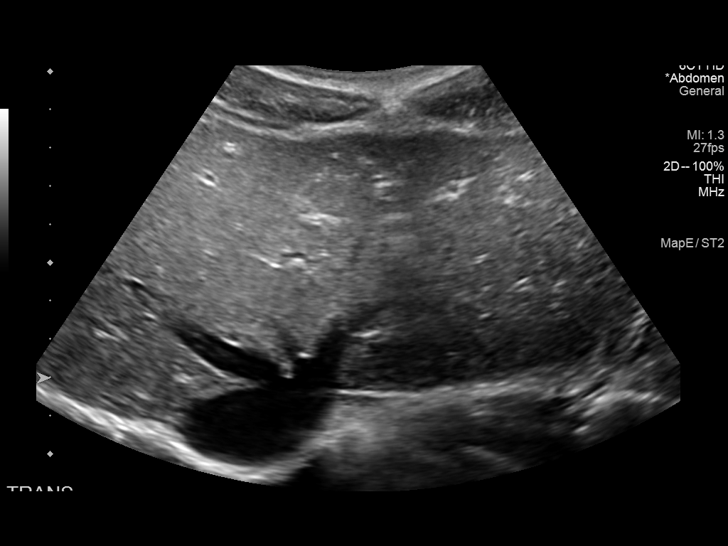
[im 49/106]
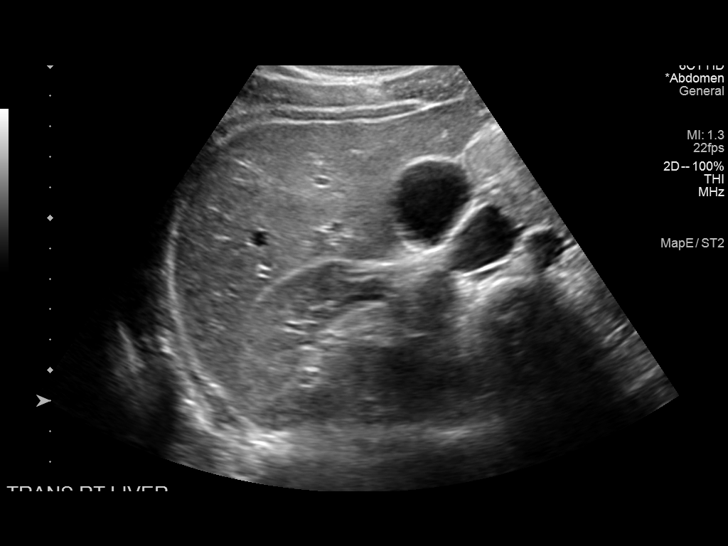
[im 57/106]
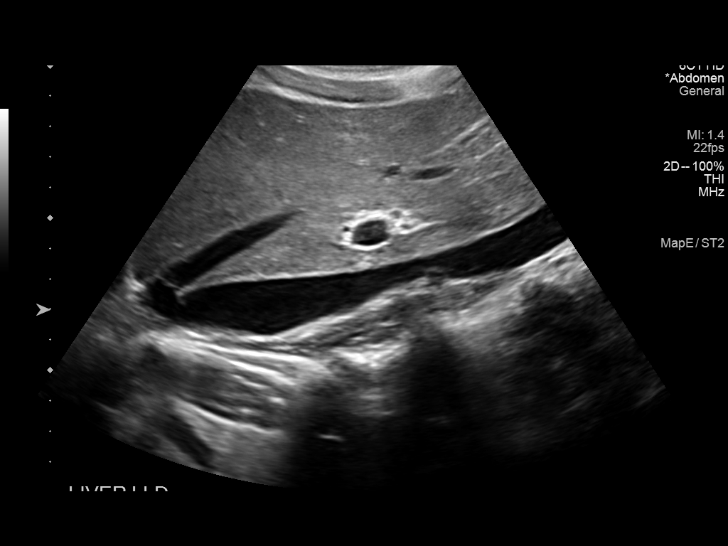
[im 66/106]
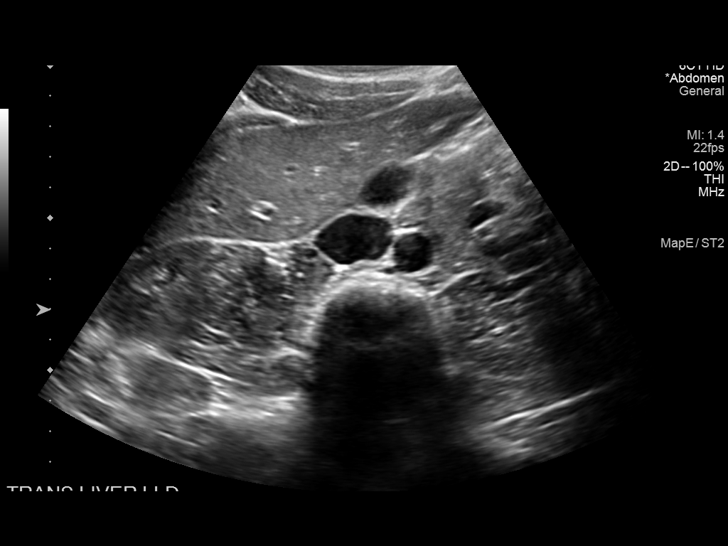
[im 71/106]
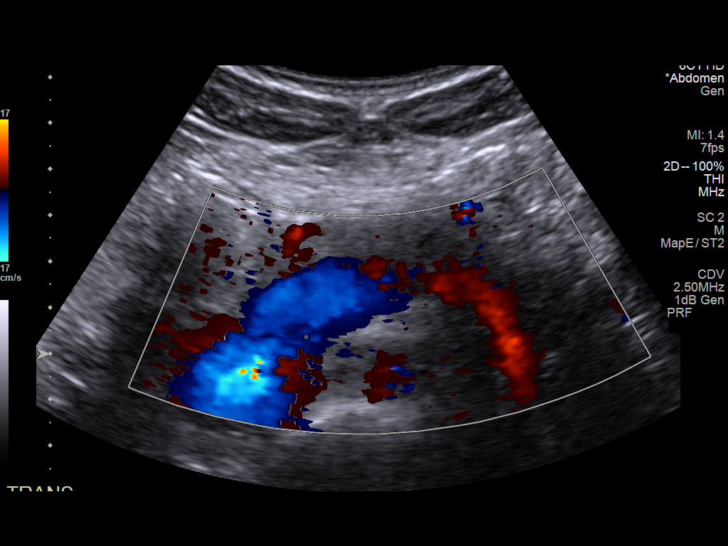
[im 79/106]
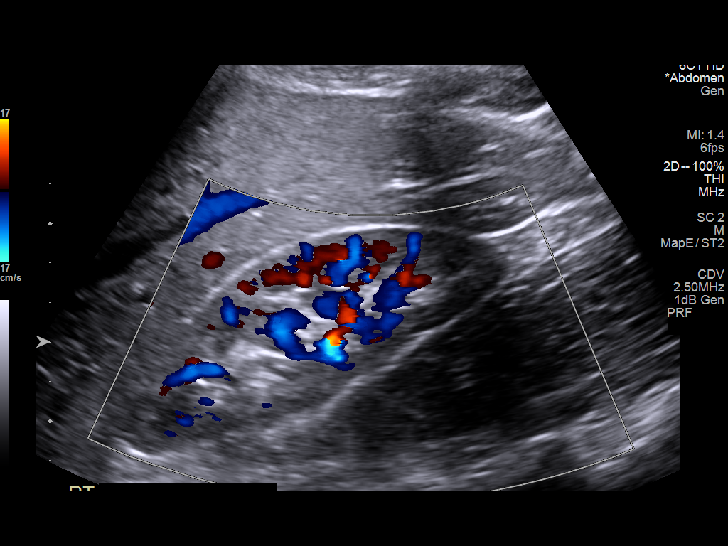
[im 88/106]
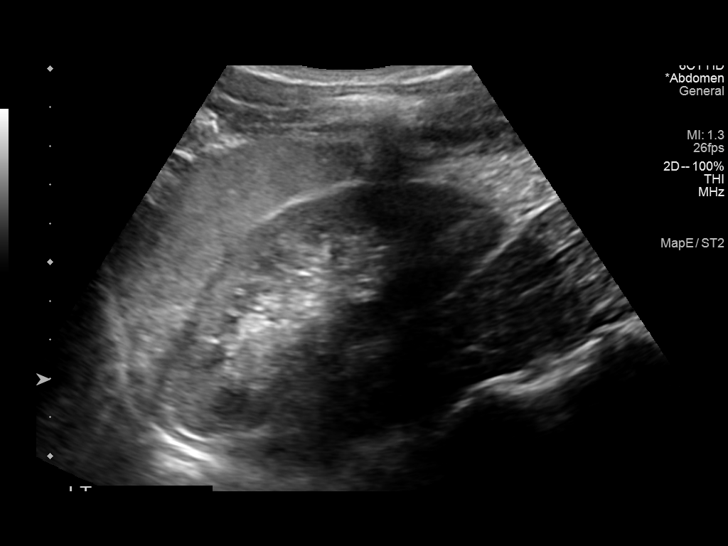
[im 97/106]
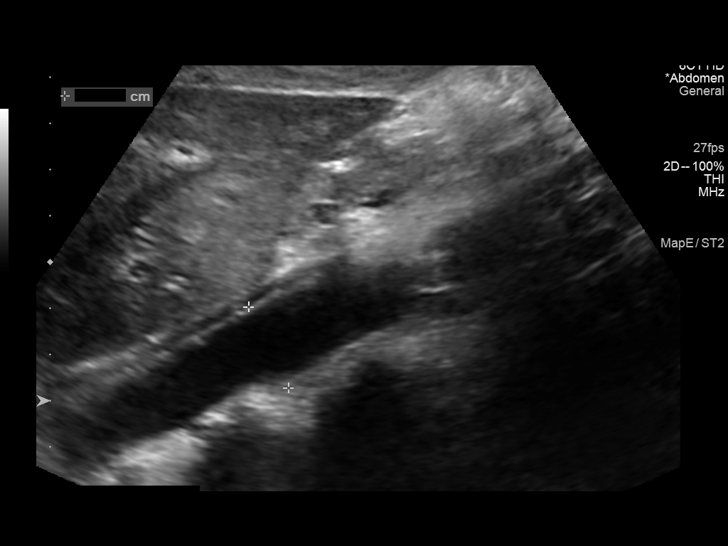
[im 106/106]
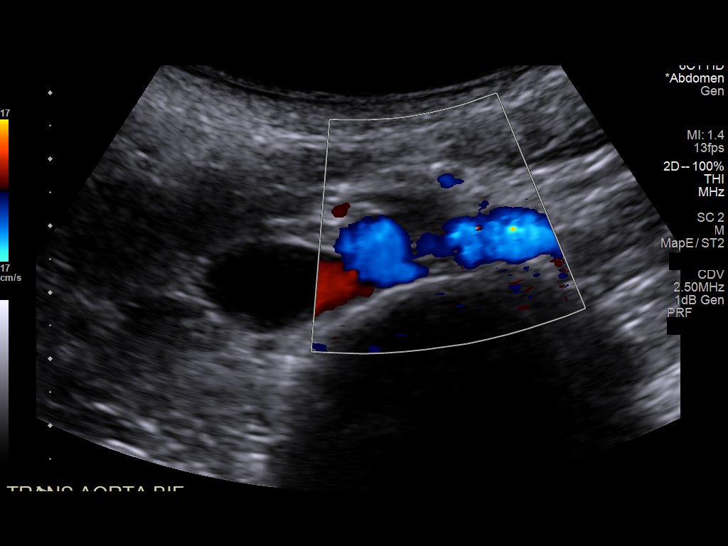

[14 of 25 positions shown; findings below may reference images not displayed]

FINDINGS: Gallbladder: No gallstones or wall thickening visualized. No
sonographic Murphy sign noted by sonographer.

Common bile duct: Diameter: 3 mm

Liver: No focal lesion identified. Within normal limits in
parenchymal echogenicity. Portal vein is patent on color Doppler
imaging with normal direction of blood flow towards the liver.

IVC: No abnormality visualized.

Pancreas: Visualized portion unremarkable.

Spleen: Size and appearance within normal limits.

Right Kidney: Length: 10.6 cm. Echogenicity within normal limits. No
mass or hydronephrosis visualized.

Left Kidney: Length: 10.1 cm. Echogenicity within normal limits. No
mass or hydronephrosis visualized.

Abdominal aorta: No aneurysm visualized.

Other findings: Trace free fluid adjacent to the posterior aspect of
the spleen, nonspecific.
IMPRESSION: 1. Trace free fluid left upper quadrant, nonspecific. Otherwise
unremarkable exam.

## 2022-10-06 ENCOUNTER — Other Ambulatory Visit (HOSPITAL_BASED_OUTPATIENT_CLINIC_OR_DEPARTMENT_OTHER): Payer: Self-pay

## 2022-11-10 ENCOUNTER — Other Ambulatory Visit: Payer: Self-pay

## 2022-11-11 ENCOUNTER — Other Ambulatory Visit (INDEPENDENT_AMBULATORY_CARE_PROVIDER_SITE_OTHER): Payer: Self-pay | Admitting: Pediatric Gastroenterology

## 2023-05-09 ENCOUNTER — Other Ambulatory Visit (INDEPENDENT_AMBULATORY_CARE_PROVIDER_SITE_OTHER): Payer: Self-pay | Admitting: Pediatric Gastroenterology

## 2023-09-28 ENCOUNTER — Other Ambulatory Visit (HOSPITAL_BASED_OUTPATIENT_CLINIC_OR_DEPARTMENT_OTHER): Payer: Self-pay

## 2023-09-28 MED ORDER — FLULAVAL 0.5 ML IM SUSY
0.5000 mL | PREFILLED_SYRINGE | Freq: Once | INTRAMUSCULAR | 0 refills | Status: AC
  Filled 2023-09-28: qty 0.5, 1d supply, fill #0

## 2023-10-30 ENCOUNTER — Other Ambulatory Visit (INDEPENDENT_AMBULATORY_CARE_PROVIDER_SITE_OTHER): Payer: Self-pay | Admitting: Pediatric Gastroenterology

## 2024-02-08 ENCOUNTER — Ambulatory Visit: Payer: 59 | Admitting: Internal Medicine

## 2024-02-08 ENCOUNTER — Encounter: Payer: Self-pay | Admitting: Internal Medicine

## 2024-02-08 VITALS — BP 120/60 | HR 74 | Temp 98.4°F | Ht 68.23 in | Wt 168.0 lb

## 2024-02-08 DIAGNOSIS — Z0000 Encounter for general adult medical examination without abnormal findings: Secondary | ICD-10-CM | POA: Diagnosis not present

## 2024-02-08 NOTE — Assessment & Plan Note (Signed)
 Flu shot yearly. Tetanus up to date. Counseled about sun safety and mole surveillance. Counseled about the dangers of distracted driving. Given 10 year screening recommendations.

## 2024-02-08 NOTE — Progress Notes (Signed)
   Subjective:   Patient ID: Justin Kline, male    DOB: 05-06-2001, 23 y.o.   MRN: [id number]  HPI The patient is a new 23 YO man coming in for physical  Review of Systems  Constitutional: Negative.   HENT: Negative.    Eyes: Negative.   Respiratory:  Negative for cough, chest tightness and shortness of breath.   Cardiovascular:  Negative for chest pain, palpitations and leg swelling.  Gastrointestinal:  Negative for abdominal distention, abdominal pain, constipation, diarrhea, nausea and vomiting.  Musculoskeletal: Negative.   Skin: Negative.   Neurological: Negative.   Psychiatric/Behavioral: Negative.      Objective:  Physical Exam Constitutional:      Appearance: He is well-developed.  HENT:     Head: Normocephalic and atraumatic.  Cardiovascular:     Rate and Rhythm: Normal rate and regular rhythm.  Pulmonary:     Effort: Pulmonary effort is normal. No respiratory distress.     Breath sounds: Normal breath sounds. No wheezing or rales.  Abdominal:     General: Bowel sounds are normal. There is no distension.     Palpations: Abdomen is soft.     Tenderness: There is no abdominal tenderness. There is no rebound.  Musculoskeletal:     Cervical back: Normal range of motion.  Skin:    General: Skin is warm and dry.  Neurological:     Mental Status: He is alert and oriented to person, place, and time.     Coordination: Coordination normal.     Vitals:   02/08/24 0813  BP: 120/60  Pulse: 74  Temp: 98.4 F (36.9 C)  TempSrc: Oral  SpO2: 98%  Weight: 168 lb (76.2 kg)  Height: 5' 8.23" (1.733 m)    Assessment & Plan:

## 2024-04-24 ENCOUNTER — Ambulatory Visit

## 2024-04-28 ENCOUNTER — Ambulatory Visit

## 2024-04-28 ENCOUNTER — Other Ambulatory Visit

## 2024-04-28 ENCOUNTER — Other Ambulatory Visit: Payer: Self-pay | Admitting: Internal Medicine

## 2024-04-28 DIAGNOSIS — Z111 Encounter for screening for respiratory tuberculosis: Secondary | ICD-10-CM

## 2024-04-30 ENCOUNTER — Ambulatory Visit: Payer: Self-pay | Admitting: Internal Medicine

## 2024-04-30 LAB — QUANTIFERON-TB GOLD PLUS
Mitogen-NIL: 7.22 [IU]/mL
NIL: 0.01 [IU]/mL
QuantiFERON-TB Gold Plus: NEGATIVE
TB1-NIL: 0 [IU]/mL
TB2-NIL: 0 [IU]/mL

## 2024-09-10 ENCOUNTER — Encounter (INDEPENDENT_AMBULATORY_CARE_PROVIDER_SITE_OTHER): Payer: Self-pay

## 2024-09-10 ENCOUNTER — Other Ambulatory Visit (HOSPITAL_BASED_OUTPATIENT_CLINIC_OR_DEPARTMENT_OTHER): Payer: Self-pay

## 2024-09-10 MED ORDER — FLUZONE 0.5 ML IM SUSY
0.5000 mL | PREFILLED_SYRINGE | Freq: Once | INTRAMUSCULAR | 0 refills | Status: AC
  Filled 2024-09-10: qty 0.5, 1d supply, fill #0

## 2024-09-11 ENCOUNTER — Encounter (INDEPENDENT_AMBULATORY_CARE_PROVIDER_SITE_OTHER): Payer: Self-pay

## 2025-02-09 ENCOUNTER — Ambulatory Visit: Payer: 59 | Admitting: Internal Medicine
